# Patient Record
Sex: Female | Born: 1995 | Race: Black or African American | Hispanic: No | Marital: Single | Smoking: Former smoker
Health system: Southern US, Community
[De-identification: ages and names within clinical notes are randomized; demographics above are authoritative.]

## PROBLEM LIST (undated history)

## (undated) DIAGNOSIS — T7840XA Allergy, unspecified, initial encounter: Secondary | ICD-10-CM

## (undated) DIAGNOSIS — R011 Cardiac murmur, unspecified: Secondary | ICD-10-CM

## (undated) HISTORY — PX: HERNIA REPAIR: SHX51

## (undated) HISTORY — DX: Cardiac murmur, unspecified: R01.1

## (undated) HISTORY — PX: TOOTH EXTRACTION: SUR596

## (undated) HISTORY — DX: Allergy, unspecified, initial encounter: T78.40XA

## (undated) HISTORY — PX: WISDOM TOOTH EXTRACTION: SHX21

---
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
## 2015-01-25 ENCOUNTER — Encounter: Payer: Self-pay | Admitting: Obstetrics

## 2015-01-25 ENCOUNTER — Ambulatory Visit (INDEPENDENT_AMBULATORY_CARE_PROVIDER_SITE_OTHER): Payer: Medicaid Other | Admitting: Obstetrics

## 2015-01-25 ENCOUNTER — Ambulatory Visit: Payer: Medicaid Other | Admitting: Obstetrics

## 2015-01-25 VITALS — BP 112/65 | HR 76 | Temp 98.7°F | Wt 112.0 lb

## 2015-01-25 DIAGNOSIS — Z3042 Encounter for surveillance of injectable contraceptive: Secondary | ICD-10-CM

## 2015-01-25 DIAGNOSIS — Z0000 Encounter for general adult medical examination without abnormal findings: Secondary | ICD-10-CM | POA: Diagnosis not present

## 2015-01-25 DIAGNOSIS — Z01419 Encounter for gynecological examination (general) (routine) without abnormal findings: Secondary | ICD-10-CM | POA: Diagnosis not present

## 2015-01-25 MED ORDER — MEDROXYPROGESTERONE ACETATE 150 MG/ML IM SUSP
150.0000 mg | INTRAMUSCULAR | Status: AC
  Administered 2015-01-25 – 2015-04-19 (×2): 150 mg via INTRAMUSCULAR

## 2015-01-25 NOTE — Addendum Note (Signed)
Addended by: Marya LandryFOSTER, SUZANNE D on: 01/25/2015 04:57 PM   Modules accepted: Orders

## 2015-01-25 NOTE — Progress Notes (Signed)
Subjective:        Dana Swanson is a 19 y.o. female here for a routine exam.  Current complaints: None.    Personal health questionnaire:  Is patient Ashkenazi Jewish, have a family history of breast and/or ovarian cancer: no Is there a family history of uterine cancer diagnosed at age < 350, gastrointestinal cancer, urinary tract cancer, family member who is a Personnel officerLynch syndrome-associated carrier: no Is the patient overweight and hypertensive, family history of diabetes, personal history of gestational diabetes, preeclampsia or PCOS: no Is patient over 4955, have PCOS,  family history of premature CHD under age 19, diabetes, smoke, have hypertension or peripheral artery disease:  no At any time, has a partner hit, kicked or otherwise hurt or frightened you?: no Over the past 2 weeks, have you felt down, depressed or hopeless?: no Over the past 2 weeks, have you felt little interest or pleasure in doing things?:no   Gynecologic History No LMP recorded. Patient has had an injection. Contraception: Depo-Provera injections Last Pap: n/a. Results were: n/a Last mammogram: n/a. Results were: n/a  Obstetric History OB History  Gravida Para Term Preterm AB SAB TAB Ectopic Multiple Living  0 0 0 0 0 0 0 0 0 0         History reviewed. No pertinent past medical history.  Past Surgical History  Procedure Laterality Date  . Hernia repair    . Wisdom tooth extraction    . Tooth extraction       Current outpatient prescriptions:  .  medroxyPROGESTERone (DEPO-PROVERA) 150 MG/ML injection, Inject 1 mL (150 mg total) into the muscle every 3 (three) months., Disp: 1 mL, Rfl: 3 No Known Allergies  History  Substance Use Topics  . Smoking status: Never Smoker   . Smokeless tobacco: Never Used  . Alcohol Use: No    History reviewed. No pertinent family history.    Review of Systems  Constitutional: negative for fatigue and weight loss Respiratory: negative for cough and  wheezing Cardiovascular: negative for chest pain, fatigue and palpitations Gastrointestinal: negative for abdominal pain and change in bowel habits Musculoskeletal:negative for myalgias Neurological: negative for gait problems and tremors Behavioral/Psych: negative for abusive relationship, depression Endocrine: negative for temperature intolerance   Genitourinary:negative for abnormal menstrual periods, genital lesions, hot flashes, sexual problems and vaginal discharge Integument/breast: negative for breast lump, breast tenderness, nipple discharge and skin lesion(s)    Objective:       BP 112/65 mmHg  Pulse 76  Temp(Src) 98.7 F (37.1 C)  Wt 112 lb (50.803 kg) General:   alert  Skin:   no rash or abnormalities  Lungs:   clear to auscultation bilaterally  Heart:   regular rate and rhythm, S1, S2 normal, no murmur, click, rub or gallop  Breasts:   normal without suspicious masses, skin or nipple changes or axillary nodes  Abdomen:  normal findings: no organomegaly, soft, non-tender and no hernia  Pelvis:  External genitalia: normal general appearance Urinary system: urethral meatus normal and bladder without fullness, nontender Vaginal: normal without tenderness, induration or masses Cervix: normal appearance Adnexa: normal bimanual exam Uterus: anteverted and non-tender, normal size   Lab Review Urine pregnancy test Labs reviewed yes Radiologic studies reviewed no    Assessment:    Healthy female exam.    Contraceptive Surveillance   Plan:    Education reviewed: low fat, low cholesterol diet, safe sex/STD prevention and weight bearing exercise. Contraception: Depo-Provera injections. Follow up in: 1 year.  No orders of the defined types were placed in this encounter.   Orders Placed This Encounter  Procedures  . SureSwab, Vaginosis/Vaginitis Plus

## 2015-01-25 NOTE — Progress Notes (Addendum)
Pt received depo injection at today's visit.  Pt was on time for injection.  Pt tolerated well.  BP 112/65 mmHg  Pulse 76  Temp(Src) 98.7 F (37.1 C)  Wt 112 lb (50.803 kg)  Administrations This Visit    medroxyPROGESTERone (DEPO-PROVERA) injection 150 mg    Admin Date Action Dose Route Administered By         01/25/2015 Given 150 mg Intramuscular Lanney GinsSuzanne D Yazlynn Birkeland, CMA

## 2015-01-28 LAB — SURESWAB, VAGINOSIS/VAGINITIS PLUS
Atopobium vaginae: NOT DETECTED Log (cells/mL)
C. ALBICANS, DNA: NOT DETECTED
C. GLABRATA, DNA: NOT DETECTED
C. parapsilosis, DNA: NOT DETECTED
C. trachomatis RNA, TMA: NOT DETECTED
C. tropicalis, DNA: NOT DETECTED
GARDNERELLA VAGINALIS: 6.8 Log (cells/mL)
LACTOBACILLUS SPECIES: NOT DETECTED Log (cells/mL)
MEGASPHAERA SPECIES: NOT DETECTED Log (cells/mL)
N. GONORRHOEAE RNA, TMA: NOT DETECTED
T. VAGINALIS RNA, QL TMA: NOT DETECTED

## 2015-01-29 ENCOUNTER — Other Ambulatory Visit: Payer: Self-pay | Admitting: Obstetrics

## 2015-01-29 DIAGNOSIS — N760 Acute vaginitis: Principal | ICD-10-CM

## 2015-01-29 DIAGNOSIS — B9689 Other specified bacterial agents as the cause of diseases classified elsewhere: Secondary | ICD-10-CM

## 2015-01-29 MED ORDER — TINIDAZOLE 500 MG PO TABS: 1000.0000 mg | ORAL_TABLET | Freq: Every day | ORAL | Status: DC

## 2015-02-15 ENCOUNTER — Encounter: Payer: Self-pay | Admitting: *Deleted

## 2015-04-19 ENCOUNTER — Ambulatory Visit (INDEPENDENT_AMBULATORY_CARE_PROVIDER_SITE_OTHER): Payer: Medicaid Other | Admitting: *Deleted

## 2015-04-19 ENCOUNTER — Encounter: Payer: Self-pay | Admitting: *Deleted

## 2015-04-19 VITALS — BP 111/65 | HR 82 | Temp 99.0°F | Ht 63.0 in | Wt 108.0 lb

## 2015-04-19 DIAGNOSIS — Z3040 Encounter for surveillance of contraceptives, unspecified: Secondary | ICD-10-CM | POA: Diagnosis not present

## 2015-04-19 DIAGNOSIS — Z3042 Encounter for surveillance of injectable contraceptive: Secondary | ICD-10-CM

## 2015-07-12 ENCOUNTER — Ambulatory Visit: Payer: Medicaid Other

## 2015-07-12 ENCOUNTER — Telehealth: Payer: Self-pay | Admitting: *Deleted

## 2015-07-12 NOTE — Telephone Encounter (Signed)
Patient wants to know when her Depo Provera is due- she has had a change in insurance and we no longer accept it. 10:35 LM on VM- Depo was due yesterday- she has 7 days from yesterday to get shot or we can change her Rx- please call to let us know.

## 2016-01-26 ENCOUNTER — Encounter: Payer: Self-pay | Admitting: Obstetrics

## 2016-01-26 ENCOUNTER — Ambulatory Visit (INDEPENDENT_AMBULATORY_CARE_PROVIDER_SITE_OTHER): Payer: BLUE CROSS/BLUE SHIELD | Admitting: Obstetrics

## 2016-01-26 VITALS — BP 103/69 | HR 65 | Wt 102.0 lb

## 2016-01-26 DIAGNOSIS — Z3042 Encounter for surveillance of injectable contraceptive: Secondary | ICD-10-CM

## 2016-01-26 DIAGNOSIS — Z01419 Encounter for gynecological examination (general) (routine) without abnormal findings: Secondary | ICD-10-CM | POA: Diagnosis not present

## 2016-01-27 ENCOUNTER — Encounter: Payer: Self-pay | Admitting: Obstetrics

## 2016-01-27 NOTE — Progress Notes (Signed)
Subjective:        Dana Swanson is a 20 y.o. female here for a routine exam.  Current complaints: None.    Personal health questionnaire:  Is patient Dana Swanson, have a family history of breast and/or ovarian cancer: no Is there a family history of uterine cancer diagnosed at age < 7750, gastrointestinal cancer, urinary tract cancer, family member who is a Personnel officerLynch syndrome-associated carrier: no Is the patient overweight and hypertensive, family history of diabetes, personal history of gestational diabetes, preeclampsia or PCOS: no Is patient over 7555, have PCOS,  family history of premature CHD under age 365, diabetes, smoke, have hypertension or peripheral artery disease:  no At any time, has a partner hit, kicked or otherwise hurt or frightened you?: no Over the past 2 weeks, have you felt down, depressed or hopeless?: no Over the past 2 weeks, have you felt little interest or pleasure in doing things?:no   Gynecologic History No LMP recorded. Patient has had an injection. Contraception: Depo-Provera injections Last Pap: n/a. Results were: n/a Last mammogram: n/a. Results were: n/a  Obstetric History OB History  Gravida Para Term Preterm AB SAB TAB Ectopic Multiple Living  0 0 0 0 0 0 0 0 0 0         History reviewed. No pertinent past medical history.  Past Surgical History  Procedure Laterality Date  . Hernia repair    . Wisdom tooth extraction    . Tooth extraction       Current outpatient prescriptions:  .  medroxyPROGESTERone (DEPO-PROVERA) 150 MG/ML injection, Inject 1 mL (150 mg total) into the muscle every 3 (three) months., Disp: 1 mL, Rfl: 3 No Known Allergies  Social History  Substance Use Topics  . Smoking status: Current Every Day Smoker    Types: Cigarettes  . Smokeless tobacco: Never Used     Comment: smoke 1-2 cig/day  . Alcohol Use: No    History reviewed. No pertinent family history.    Review of Systems  Constitutional: negative for  fatigue and weight loss Respiratory: negative for cough and wheezing Cardiovascular: negative for chest pain, fatigue and palpitations Gastrointestinal: negative for abdominal pain and change in bowel habits Musculoskeletal:negative for myalgias Neurological: negative for gait problems and tremors Behavioral/Psych: negative for abusive relationship, depression Endocrine: negative for temperature intolerance   Genitourinary:negative for abnormal menstrual periods, genital lesions, hot flashes, sexual problems and vaginal discharge Integument/breast: negative for breast lump, breast tenderness, nipple discharge and skin lesion(s)    Objective:       BP 103/69 mmHg  Pulse 65  Wt 102 lb (46.267 kg) General:   alert  Skin:   no rash or abnormalities  Lungs:   clear to auscultation bilaterally  Heart:   regular rate and rhythm, S1, S2 normal, no murmur, click, rub or gallop  Breasts:   normal without suspicious masses, skin or nipple changes or axillary nodes  Abdomen:  normal findings: no organomegaly, soft, non-tender and no hernia  Pelvis:  External genitalia: normal general appearance Urinary system: urethral meatus normal and bladder without fullness, nontender Vaginal: normal without tenderness, induration or masses Cervix: normal appearance Adnexa: normal bimanual exam Uterus: anteverted and non-tender, normal size   Lab Review Urine pregnancy test Labs reviewed yes Radiologic studies reviewed no   Assessment:    Healthy female exam.    Plan:    Education reviewed: low fat, low cholesterol diet, safe sex/STD prevention and weight bearing exercise. Contraception: Depo-Provera injections. Follow  up in: 1 year.   No orders of the defined types were placed in this encounter.   Orders Placed This Encounter  Procedures  . NuSwab Vaginitis Plus (VG+)

## 2016-01-31 LAB — NUSWAB VAGINITIS PLUS (VG+)
ATOPOBIUM VAGINAE: HIGH {score} — AB
CANDIDA ALBICANS, NAA: NEGATIVE
CANDIDA GLABRATA, NAA: NEGATIVE
Chlamydia trachomatis, NAA: NEGATIVE
NEISSERIA GONORRHOEAE, NAA: NEGATIVE
Trich vag by NAA: NEGATIVE

## 2017-01-29 ENCOUNTER — Ambulatory Visit: Payer: Self-pay | Admitting: Obstetrics

## 2021-02-04 ENCOUNTER — Other Ambulatory Visit: Payer: Self-pay

## 2021-02-04 ENCOUNTER — Ambulatory Visit
Admission: EM | Admit: 2021-02-04 | Discharge: 2021-02-04 | Disposition: A | Discharge status: Home / Self Care | Payer: Medicaid Other | Attending: Family Medicine | Admitting: Family Medicine

## 2021-02-04 ENCOUNTER — Ambulatory Visit (INDEPENDENT_AMBULATORY_CARE_PROVIDER_SITE_OTHER): Payer: Medicaid Other

## 2021-02-04 DIAGNOSIS — S63502A Unspecified sprain of left wrist, initial encounter: Secondary | ICD-10-CM | POA: Diagnosis not present

## 2021-02-04 DIAGNOSIS — M25532 Pain in left wrist: Secondary | ICD-10-CM

## 2021-02-04 DIAGNOSIS — W19XXXA Unspecified fall, initial encounter: Secondary | ICD-10-CM

## 2021-02-04 NOTE — ED Triage Notes (Signed)
Pt present left wrist pain. Pt states a month ago she fall and possible injured her left wrist.

## 2021-02-04 NOTE — ED Provider Notes (Signed)
[facility]   [id number] 02/04/21 Arrival Time: 7829  FA:[id number] PAIN  SUBJECTIVE: History from: patient. OMER MONTER is a 25 y.o. female complains of L wrist pain that began about a month ago after a fall and catching herself with her left hand.   Localizes the pain to the radial aspect of the L wrist. Describes the pain as intermittent and achy in character with intermittent sharp pain and popping. Has tried OTC medications without relief. Symptoms are made worse with activity.  Denies similar symptoms in the past. Denies fever, chills, erythema, ecchymosis, effusion, weakness, numbness and tingling, saddle paresthesias, loss of bowel or bladder function.      ROS: As per HPI.  All other pertinent ROS negative.     History reviewed. No pertinent past medical history. Past Surgical History:  Procedure Laterality Date  . HERNIA REPAIR    . TOOTH EXTRACTION    . WISDOM TOOTH EXTRACTION     No Known Allergies No current facility-administered medications on file prior to encounter.   Current Outpatient Medications on File Prior to Encounter  Medication Sig Dispense Refill  . medroxyPROGESTERone (DEPO-PROVERA) 150 MG/ML injection Inject 1 mL (150 mg total) into the muscle every 3 (three) months. 1 mL 3   Social History   Socioeconomic History  . Marital status: Single    Spouse name: Not on file  . Number of children: Not on file  . Years of education: Not on file  . Highest education level: Not on file  Occupational History  . Not on file  Tobacco Use  . Smoking status: Current Every Day Smoker    Types: Cigarettes  . Smokeless tobacco: Never Used  . Tobacco comment: smoke 1-2 cig/day  Substance and Sexual Activity  . Alcohol use: No    Alcohol/week: 0.0 standard drinks  . Drug use: No    Frequency: 1.0 times per week    Types: Marijuana  . Sexual activity: Yes    Partners: Male    Birth control/protection: Injection  Other Topics Concern  . Not on file   Social History Narrative   ** Merged History Encounter **       Social Determinants of Health   Financial Resource Strain: Not on file  Food Insecurity: Not on file  Transportation Needs: Not on file  Physical Activity: Not on file  Stress: Not on file  Social Connections: Not on file  Intimate Partner Violence: Not on file   History reviewed. No pertinent family history.  OBJECTIVE:  Vitals:   02/04/21 0941  BP: 117/79  Pulse: (!) 106  Resp: 16  Temp: 97.9 F (36.6 C)  TempSrc: Oral  SpO2: 98%    General appearance: ALERT; in no acute distress.  Head: NCAT Lungs: Normal respiratory effort CV: pulses 2+ bilaterally. Cap refill < 2 seconds Musculoskeletal:  Inspection: Skin warm, dry, clear and intact No erythema, effusion noted Palpation: Radial L wrist tender to palpation ROM: Limited ROM active and passive to L wrist Skin: warm and dry Neurologic: Ambulates without difficulty; Sensation intact about the upper/ lower extremities Psychological: alert and cooperative; normal mood and affect  DIAGNOSTIC STUDIES:  DG Wrist Complete Left  Result Date: 02/04/2021 CLINICAL DATA:  25 year old female with a history of fall and pain EXAM: LEFT WRIST - COMPLETE 3+ VIEW COMPARISON:  None. FINDINGS: There is no evidence of fracture or dislocation. There is no evidence of arthropathy or other focal bone abnormality. Soft tissues are unremarkable. IMPRESSION: Negative.  Electronically Signed   By: Gilmer Mor D.O.   On: 02/04/2021 10:11     ASSESSMENT & PLAN:  1. Sprain of left wrist, initial encounter   2. Left wrist pain   3. Fall, initial encounter    Xray negative for fracture or misalignment Brace applied in office Suspect soft tissue injury Continue conservative management of rest, ice, and gentle stretches Take ibuprofen as needed for pain relief (may cause abdominal discomfort, ulcers, and GI bleeds avoid taking with other NSAIDs) Follow up with ortho if  symptoms persist Return or go to the ER if you have any new or worsening symptoms (fever, chills, chest pain, abdominal pain, changes in bowel or bladder habits, pain radiating into lower legs)    Reviewed expectations re: course of current medical issues. Questions answered. Outlined signs and symptoms indicating need for more acute intervention. Patient verbalized understanding. After Visit Summary given.       Moshe Cipro, NP 02/04/21 1744

## 2021-02-04 NOTE — Discharge Instructions (Signed)
Xray today was negative for fracture or misalignment  Wear the wrist brace when active and when sleeping  May take ibuprofen, tylenol as needed for pain  May use ice to the area as needed for swelling  Follow up with orthopedics if symptoms persist.

## 2021-08-13 IMAGING — DX DG WRIST COMPLETE 3+V*L*
4 series · 4 of 4 positions shown · non-contrast
Comparison: None.

CLINICAL DATA: 25-year-old female with a history of fall and pain

EXAM:
LEFT WRIST - COMPLETE 3+ VIEW

[wrist pa (1 of 3)]
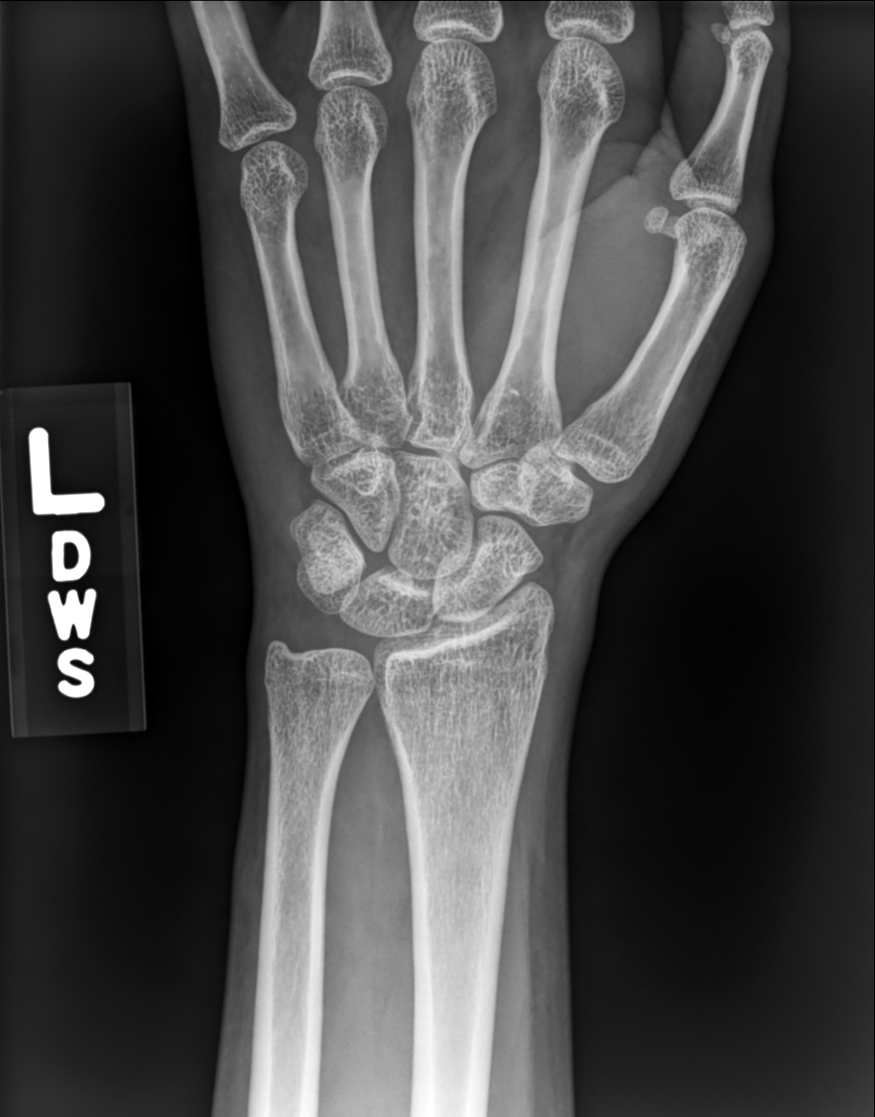

[wrist pa (2 of 3)]
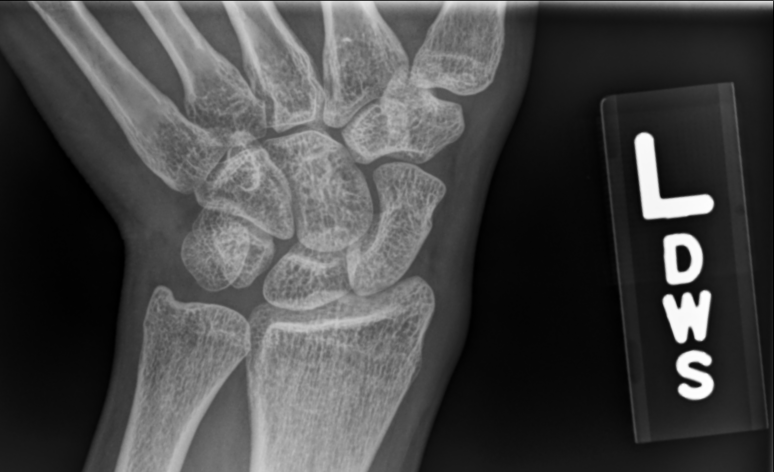

[wrist pa (3 of 3)]
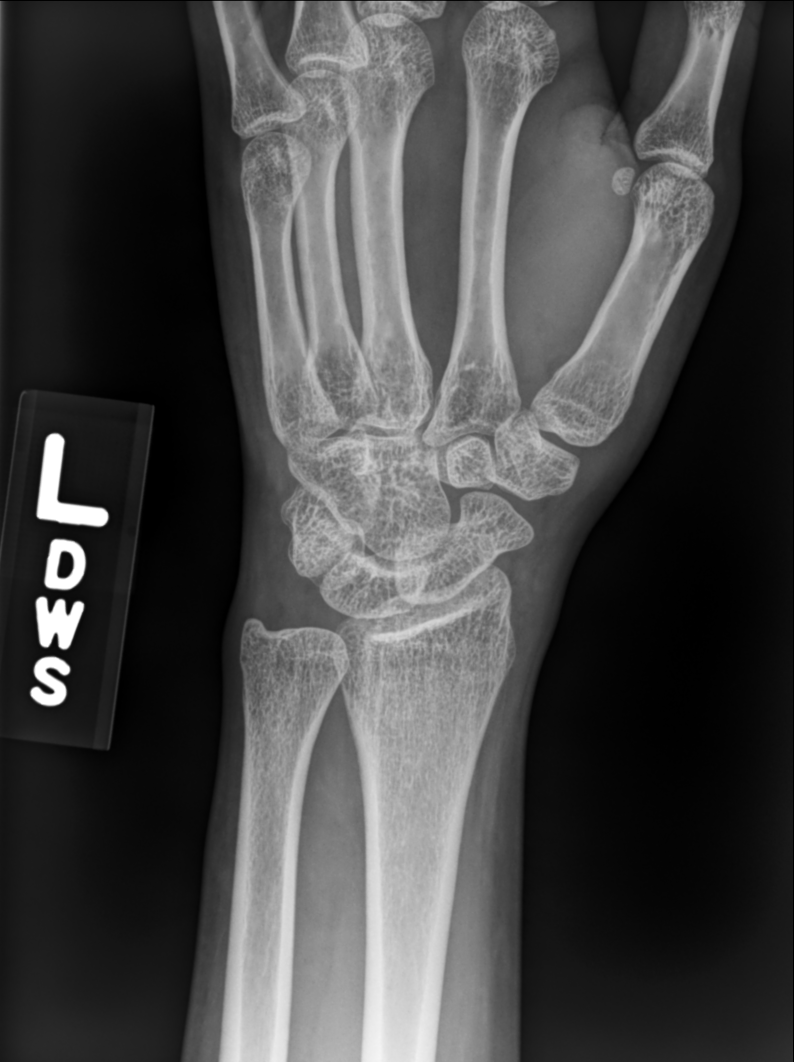

[wrist lat]
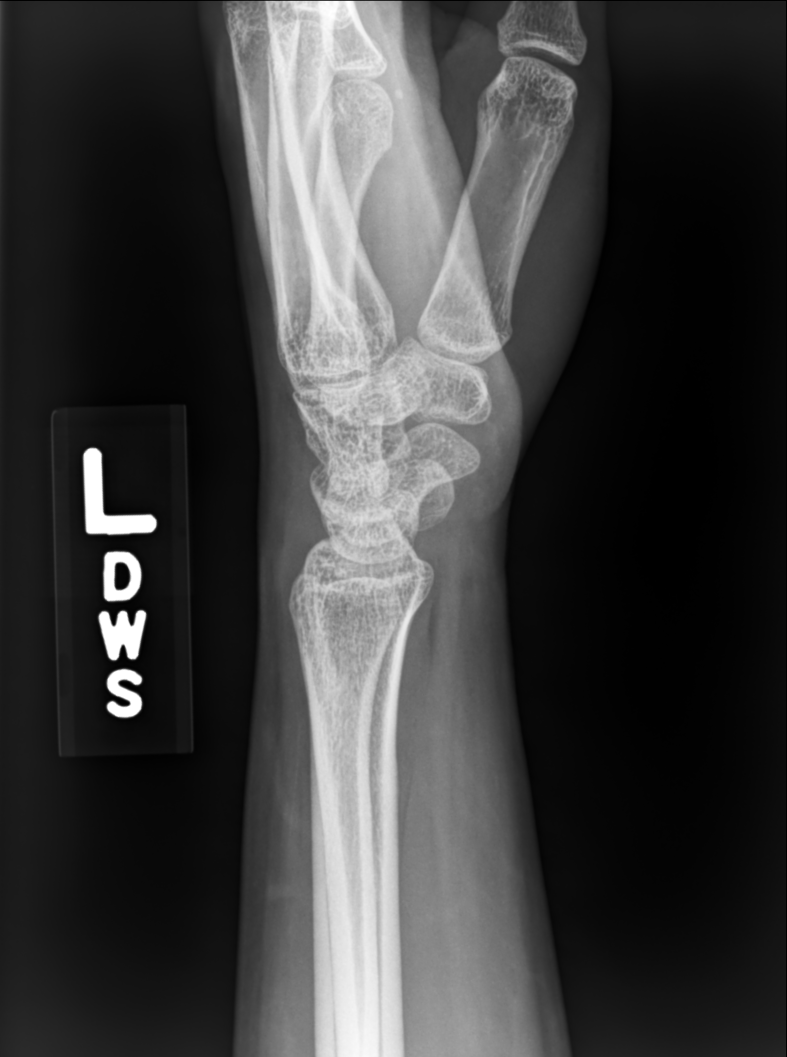

[4 of 4 positions shown; findings below may reference images not displayed]

FINDINGS: There is no evidence of fracture or dislocation. There is no
evidence of arthropathy or other focal bone abnormality. Soft
tissues are unremarkable.
IMPRESSION: Negative.

## 2023-03-12 LAB — AMB RESULTS CONSOLE CBG: Glucose: 109

## 2023-03-12 NOTE — Progress Notes (Signed)
Pt has PCP. No SDOH needs at this time.  

## 2023-04-02 ENCOUNTER — Encounter: Payer: Self-pay | Admitting: *Deleted

## 2023-04-02 NOTE — Progress Notes (Signed)
Pt attended screening event on 03/12/23, where screening results were wnl. At the event, Pt shared PCP info as [facility] and no SDOH insecurities was indicated. Per chart review, Pt PCP is listed as Chales Abrahams, [place] in [facility]. Pt was contacted by phone for PCP status. Pt returned caller call. During the call, Pt shared that she put [facility] at the screening form because she did not know what to put there. Pt agreed [facility] and [facility] to be mailed to her to assist in finding PCP. Pt also shared that she has insurance but she will like [place] medicaid eligibilities flyers mailed to her until she is able to sign up for health benefit this September through her job. Letter was sent to Pt with PCP resources.

## 2023-05-24 ENCOUNTER — Encounter: Payer: Self-pay | Admitting: *Deleted

## 2023-05-24 NOTE — Progress Notes (Signed)
Pt attended 03/12/23 event where her b/p was 109/72 and her blood sugar was 109. At the event, the pt had identified her PCP as [facility] but during initial event f/u, pt had shared she did not have a PCP, also confirming she had not SDOH insecurities at the time. The pt also shared she hoped to get health benefits from her job in Sept, and PCP info was mailed to pt. During this second event f/u, pt was called and vm left to check on her PCP status and offer additional support as needed. Health equity team member unable to contact pt by phone during second event f/u efforts, so 2nd letter offering PCP options with the [facility] Care [facility] and [facility] flyers sent to pt

## 2023-09-24 ENCOUNTER — Encounter: Payer: Self-pay | Admitting: *Deleted

## 2023-09-24 NOTE — Progress Notes (Signed)
Pt attended 03/12/23 screening event where her b/p was 109/72 and her blood sugar was 109. While at the event, the pt noted no current PCP (listed a possible contact but not an established PCP), did not identify any SDOH insecurities. During the initial event f/u, pt had shared she anticipated getting a job and insurance effective 9/24, but health equity team member unable to contact pt by phone during 60 day and 6 month f/u and chart review does not indicate any primary or other [facility]-visible healthcare encounter since the event. A final f/u letter sent to pt with both PCP and insurance info in case pt did not establish primary care or get insurance as planned, including Get Care Now and community primary care clinic info, Medicaid, [employer], and [facility] financial assistance info. No additional health equity team support scheduled at this time.

## 2023-10-15 DIAGNOSIS — F4322 Adjustment disorder with anxiety: Secondary | ICD-10-CM | POA: Diagnosis not present

## 2023-10-23 ENCOUNTER — Other Ambulatory Visit (HOSPITAL_COMMUNITY)
Admission: RE | Admit: 2023-10-23 | Discharge: 2023-10-23 | Disposition: A | Discharge status: Home / Self Care | Payer: BC Managed Care – PPO | Source: Ambulatory Visit | Attending: Internal Medicine | Admitting: Internal Medicine

## 2023-10-23 ENCOUNTER — Ambulatory Visit (INDEPENDENT_AMBULATORY_CARE_PROVIDER_SITE_OTHER): Payer: BC Managed Care – PPO | Admitting: Nurse Practitioner

## 2023-10-23 VITALS — BP 111/83 | HR 78 | Temp 97.3°F | Ht 62.0 in | Wt 126.9 lb

## 2023-10-23 DIAGNOSIS — H6123 Impacted cerumen, bilateral: Secondary | ICD-10-CM

## 2023-10-23 DIAGNOSIS — Z1329 Encounter for screening for other suspected endocrine disorder: Secondary | ICD-10-CM

## 2023-10-23 DIAGNOSIS — Z23 Encounter for immunization: Secondary | ICD-10-CM | POA: Diagnosis not present

## 2023-10-23 DIAGNOSIS — Z0000 Encounter for general adult medical examination without abnormal findings: Secondary | ICD-10-CM

## 2023-10-23 DIAGNOSIS — Z130 Encounter for screening for diseases of the blood and blood-forming organs and certain disorders involving the immune mechanism: Secondary | ICD-10-CM

## 2023-10-23 DIAGNOSIS — Z124 Encounter for screening for malignant neoplasm of cervix: Secondary | ICD-10-CM | POA: Insufficient documentation

## 2023-10-23 DIAGNOSIS — Z1321 Encounter for screening for nutritional disorder: Secondary | ICD-10-CM

## 2023-10-23 DIAGNOSIS — Z13228 Encounter for screening for other metabolic disorders: Secondary | ICD-10-CM

## 2023-10-23 MED ORDER — DEBROX 6.5 % OT SOLN: 5.0000 [drp] | Freq: Two times a day (BID) | OTIC | 0 refills | Status: AC

## 2023-10-23 NOTE — Patient Instructions (Addendum)
 Please consider getting influenza vaccine at local pharmacy.   OK to use Debrox (peroxide) in the ear to loosen up wax. Also recommend using a bulb syringe (for removing boogers from baby's noses) to flush through warm water and vinegar (3-4:1 ratio). An alternative, though more expensive, is an elephant ear washer wax removal kit. Do not use Q-tips as this can impact wax further.     Excessive ear wax, bilateral  - carbamide peroxide (DEBROX) 6.5 % OTIC solution; Place 5 drops into both ears 2 (two) times daily.  Dispense: 15 mL; Refill: 0     It is important that you exercise regularly at least 30 minutes 5 times a week as tolerated  Think about what you will eat, plan ahead. Choose  clean, green, fresh or frozen over canned, processed or packaged foods which are more sugary, salty and fatty. 70 to 75% of food eaten should be vegetables and fruit. Three meals at set times with snacks allowed between meals, but they must be fruit or vegetables. Aim to eat over a 12 hour period , example 7 am to 7 pm, and STOP after  your last meal of the day. Drink water,generally about 64 ounces per day, no other drink is as healthy. Fruit juice is best enjoyed in a healthy way, by EATING the fruit.  Thanks for choosing [facility] we consider it a privelige to serve you.

## 2023-10-23 NOTE — Assessment & Plan Note (Signed)
 Annual exam as documented.  Counseling done include healthy lifestyle involving committing to 150 minutes of exercise per week, heart healthy diet, and attaining healthy weight. The importance of adequate sleep also discussed.  Regular use of seat belt and home safety were also discussed . Changes in health habits are decided on by patient with goals and time frames set for achieving them. Immunization and cancer screening  needs are specifically addressed at this visit.    1. Cervical cancer screening  - Cytology - PAP([place])  2. Need for Tdap vaccination (Primary)   3. Screening for endocrine, nutritional, metabolic and immunity disorder  - CBC - CMP14+EGFR - HIV Antibody (routine testing w rflx) - Lipid panel - Hepatitis C antibody - VITAMIN D 25 Hydroxy (Vit-D Deficiency, Fractures)

## 2023-10-23 NOTE — Progress Notes (Signed)
 Complete physical exam  Patient: Dana Swanson   DOB: Sep 14, 1996   27 y.o. Female  MRN: [id number]  Subjective:    Chief Complaint  Patient presents with   Establish Care    Dana Swanson is a 27 y.o. female who presents to establish care and for a CPE today for a complete physical exam. She reports consuming a general diet. The patient does not participate in regular exercise at present. She generally feels well. She reports sleeping well. She does not have additional problems to discuss today.   Pap smear completed in the office today, Tdap vaccine administered.  Patient encouraged to consider getting the flu vaccine.   Most recent fall risk assessment:     No data to display           Most recent depression screenings:    10/23/2023    8:16 AM  PHQ 2/9 Scores  PHQ - 2 Score 0        Patient Care Team: Scarlett Ronal Caldron, NP as PCP - General   Outpatient Medications Prior to Visit  Medication Sig   [DISCONTINUED] medroxyPROGESTERone  (DEPO-PROVERA ) 150 MG/ML injection Inject 1 mL (150 mg total) into the muscle every 3 (three) months.   No facility-administered medications prior to visit.    Review of Systems  Constitutional:  Negative for appetite change, chills, fatigue and fever.  HENT:  Negative for congestion, postnasal drip, rhinorrhea and sneezing.   Eyes:  Negative for pain, discharge and itching.  Respiratory:  Negative for cough, shortness of breath and wheezing.   Cardiovascular:  Negative for chest pain, palpitations and leg swelling.  Gastrointestinal:  Negative for abdominal pain, constipation, nausea and vomiting.  Endocrine: Negative for polydipsia, polyphagia and polyuria.  Genitourinary:  Negative for difficulty urinating, dysuria, flank pain and frequency.  Musculoskeletal:  Negative for arthralgias, back pain, joint swelling and myalgias.  Skin:  Negative for color change, pallor, rash and wound.  Allergic/Immunologic: Positive for  environmental allergies and food allergies. Negative for immunocompromised state.  Neurological:  Negative for dizziness, facial asymmetry, weakness, numbness and headaches.  Psychiatric/Behavioral:  Negative for behavioral problems, confusion, self-injury and suicidal ideas.        Objective:     BP 111/83   Pulse 78   Temp (!) 97.3 F (36.3 C)   Ht 5' 2 (1.575 m)   Wt 126 lb 14.4 oz (57.6 kg)   SpO2 100%   BMI 23.21 kg/m    Physical Exam Vitals and nursing note reviewed. Exam conducted with a chaperone present.  Constitutional:      General: She is not in acute distress.    Appearance: Normal appearance. She is not ill-appearing, toxic-appearing or diaphoretic.  HENT:     Right Ear: Tympanic membrane, ear canal and external ear normal. There is impacted cerumen.     Left Ear: Tympanic membrane, ear canal and external ear normal. There is impacted cerumen.     Nose: Nose normal. No congestion or rhinorrhea.     Mouth/Throat:     Mouth: Mucous membranes are moist.     Pharynx: Oropharynx is clear. No oropharyngeal exudate or posterior oropharyngeal erythema.  Eyes:     General: No scleral icterus.       Right eye: No discharge.        Left eye: No discharge.     Extraocular Movements: Extraocular movements intact.     Conjunctiva/sclera: Conjunctivae normal.  Neck:     Vascular:  No carotid bruit.  Cardiovascular:     Rate and Rhythm: Normal rate and regular rhythm.     Pulses: Normal pulses.     Heart sounds: Normal heart sounds. No murmur heard.    No friction rub. No gallop.  Pulmonary:     Effort: Pulmonary effort is normal. No respiratory distress.     Breath sounds: Normal breath sounds. No stridor. No wheezing, rhonchi or rales.  Chest:     Chest wall: No mass, lacerations, deformity, swelling, tenderness or edema.  Breasts:    Tanner Score is 5.     Breasts are symmetrical.     Right: Normal. No swelling, bleeding, inverted nipple, mass, nipple discharge,  skin change or tenderness.     Left: Normal. No swelling, bleeding, inverted nipple, mass, nipple discharge, skin change or tenderness.  Abdominal:     General: Bowel sounds are normal. There is no distension.     Palpations: Abdomen is soft. There is no mass.     Tenderness: There is no abdominal tenderness. There is no right CVA tenderness, left CVA tenderness, guarding or rebound.     Hernia: No hernia is present. There is no hernia in the left inguinal area or right inguinal area.  Genitourinary:    General: Normal vulva.     Exam position: Lithotomy position.     Pubic Area: No rash or pubic lice.      Tanner stage (genital): 5.     Labia:        Right: No rash, tenderness, lesion or injury.        Left: No rash, tenderness, lesion or injury.      Urethra: No prolapse, urethral pain, urethral swelling or urethral lesion.     Vagina: No signs of injury and foreign body. No vaginal discharge, erythema, tenderness, bleeding, lesions or prolapsed vaginal walls.     Cervix: No cervical motion tenderness, discharge, friability, lesion, erythema, cervical bleeding or eversion.     Uterus: Normal. Not enlarged, not fixed, not tender and no uterine prolapse.      Adnexa:        Right: No mass, tenderness or fullness.         Left: No mass, tenderness or fullness.    Musculoskeletal:        General: No swelling, tenderness, deformity or signs of injury.     Cervical back: Normal range of motion and neck supple. No rigidity or tenderness.     Right lower leg: No edema.     Left lower leg: No edema.  Lymphadenopathy:     Cervical: No cervical adenopathy.     Upper Body:     Right upper body: No supraclavicular, axillary or pectoral adenopathy.     Left upper body: No supraclavicular, axillary or pectoral adenopathy.     Lower Body: No right inguinal adenopathy. No left inguinal adenopathy.  Skin:    General: Skin is warm and dry.     Capillary Refill: Capillary refill takes less than 2  seconds.     Coloration: Skin is not jaundiced or pale.     Findings: No bruising, erythema, lesion or rash.  Neurological:     Mental Status: She is alert and oriented to person, place, and time.     Cranial Nerves: No cranial nerve deficit.     Sensory: No sensory deficit.     Motor: No weakness.     Coordination: Coordination normal.     Gait:  Gait normal.     Deep Tendon Reflexes: Reflexes normal.  Psychiatric:        Mood and Affect: Mood normal.        Behavior: Behavior normal.        Thought Content: Thought content normal.        Judgment: Judgment normal.     No results found for any visits on 10/23/23.     Assessment & Plan:    Routine Health Maintenance and Physical Exam  Immunization History  Administered Date(s) Administered   Tdap 10/23/2023    Health Maintenance  Topic Date Due   HIV Screening  Never done   Hepatitis C Screening  Never done   Cervical Cancer Screening (Pap smear)  Never done   COVID-19 Vaccine (1 - 2024-25 season) Never done   INFLUENZA VACCINE  01/21/2024 (Originally 05/24/2023)   DTaP/Tdap/Td (2 - Td or Tdap) 10/22/2033   HPV VACCINES  Aged Out    Discussed health benefits of physical activity, and encouraged her to engage in regular exercise appropriate for her age and condition.  Problem List Items Addressed This Visit       Nervous and Auditory   Excessive ear wax, bilateral    - carbamide peroxide (DEBROX) 6.5 % OTIC solution; Place 5 drops into both ears 2 (two) times daily.  Dispense: 15 mL; Refill: 0       Relevant Medications   carbamide peroxide (DEBROX) 6.5 % OTIC solution     Other   Need for Tdap vaccination   Patient educated on CDC recommendation for the vaccine. Verbal consent was obtained from the patient, vaccine administered by nurse, no sign of adverse reactions noted at this time. Patient education on arm soreness and use of tylenol  for this patient  was discussed. Patient educated on the signs and  symptoms of adverse effect and advise to contact the office if they occur.       Relevant Orders   Tdap vaccine greater than or equal to 7yo IM (Completed)   Cervical cancer screening   Pap smear completed without difficulty Sample sent to the lab for testing      Relevant Orders   Cytology - PAP([place])   Annual physical exam - Primary   Annual exam as documented.  Counseling done include healthy lifestyle involving committing to 150 minutes of exercise per week, heart healthy diet, and attaining healthy weight. The importance of adequate sleep also discussed.  Regular use of seat belt and home safety were also discussed . Changes in health habits are decided on by patient with goals and time frames set for achieving them. Immunization and cancer screening  needs are specifically addressed at this visit.    1. Cervical cancer screening  - Cytology - PAP([place])  2. Need for Tdap vaccination (Primary)   3. Screening for endocrine, nutritional, metabolic and immunity disorder  - CBC - CMP14+EGFR - HIV Antibody (routine testing w rflx) - Lipid panel - Hepatitis C antibody - VITAMIN D 25 Hydroxy (Vit-D Deficiency, Fractures)        Other Visit Diagnoses       Screening for endocrine, nutritional, metabolic and immunity disorder       Relevant Orders   CBC   CMP14+EGFR   HIV Antibody (routine testing w rflx)   Lipid panel   Hepatitis C antibody   VITAMIN D 25 Hydroxy (Vit-D Deficiency, Fractures)      Return in about 1 year (around  10/22/2024) for CPE.     Magaby Rumberger R Adolph Clutter, FNP

## 2023-10-23 NOTE — Assessment & Plan Note (Signed)
 Pap smear completed without difficulty Sample sent to the lab for testing

## 2023-10-23 NOTE — Assessment & Plan Note (Signed)
  carbamide peroxide (DEBROX) 6.5 % OTIC solution; Place 5 drops into both ears 2 (two) times daily.  Dispense: 15 mL; Refill: 0

## 2023-10-23 NOTE — Assessment & Plan Note (Signed)
 Patient educated on CDC recommendation for the vaccine. Verbal consent was obtained from the patient, vaccine administered by nurse, no sign of adverse reactions noted at this time. Patient education on arm soreness and use of tylenol  for this patient  was discussed. Patient educated on the signs and symptoms of adverse effect and advise to contact the office if they occur.  ?

## 2023-10-25 LAB — CYTOLOGY - PAP
Adequacy: ABSENT
Comment: NEGATIVE
Diagnosis: NEGATIVE
High risk HPV: NEGATIVE

## 2023-10-26 ENCOUNTER — Other Ambulatory Visit: Payer: Self-pay | Admitting: Nurse Practitioner

## 2023-10-26 DIAGNOSIS — Z130 Encounter for screening for diseases of the blood and blood-forming organs and certain disorders involving the immune mechanism: Secondary | ICD-10-CM

## 2024-10-24 ENCOUNTER — Ambulatory Visit: Payer: Self-pay | Admitting: Nurse Practitioner
